# Patient Record
Sex: Female | Born: 1995
Health system: Southern US, Community
[De-identification: ages and names within clinical notes are randomized; demographics above are authoritative.]

## PROBLEM LIST (undated history)

## (undated) DIAGNOSIS — F509 Eating disorder, unspecified: Secondary | ICD-10-CM

## (undated) HISTORY — DX: Eating disorder, unspecified: F50.9

---
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
## 2017-10-18 DIAGNOSIS — J069 Acute upper respiratory infection, unspecified: Secondary | ICD-10-CM | POA: Diagnosis not present

## 2017-10-18 DIAGNOSIS — R69 Illness, unspecified: Secondary | ICD-10-CM | POA: Diagnosis not present

## 2017-12-08 DIAGNOSIS — F4323 Adjustment disorder with mixed anxiety and depressed mood: Secondary | ICD-10-CM | POA: Diagnosis not present

## 2017-12-18 DIAGNOSIS — N3001 Acute cystitis with hematuria: Secondary | ICD-10-CM | POA: Diagnosis not present

## 2018-02-03 DIAGNOSIS — K5900 Constipation, unspecified: Secondary | ICD-10-CM | POA: Diagnosis not present

## 2018-02-03 DIAGNOSIS — F419 Anxiety disorder, unspecified: Secondary | ICD-10-CM | POA: Diagnosis not present

## 2018-02-03 DIAGNOSIS — Z6829 Body mass index (BMI) 29.0-29.9, adult: Secondary | ICD-10-CM | POA: Diagnosis not present

## 2018-02-03 DIAGNOSIS — Z0000 Encounter for general adult medical examination without abnormal findings: Secondary | ICD-10-CM | POA: Diagnosis not present

## 2018-02-03 DIAGNOSIS — Z23 Encounter for immunization: Secondary | ICD-10-CM | POA: Diagnosis not present

## 2018-02-03 DIAGNOSIS — F418 Other specified anxiety disorders: Secondary | ICD-10-CM | POA: Diagnosis not present

## 2018-03-09 DIAGNOSIS — J111 Influenza due to unidentified influenza virus with other respiratory manifestations: Secondary | ICD-10-CM | POA: Diagnosis not present

## 2018-06-30 DIAGNOSIS — J069 Acute upper respiratory infection, unspecified: Secondary | ICD-10-CM | POA: Diagnosis not present

## 2018-06-30 DIAGNOSIS — R05 Cough: Secondary | ICD-10-CM | POA: Diagnosis not present

## 2018-08-20 DIAGNOSIS — Z1159 Encounter for screening for other viral diseases: Secondary | ICD-10-CM | POA: Diagnosis not present

## 2018-12-12 DIAGNOSIS — Z20828 Contact with and (suspected) exposure to other viral communicable diseases: Secondary | ICD-10-CM | POA: Diagnosis not present

## 2019-01-02 DIAGNOSIS — Z20828 Contact with and (suspected) exposure to other viral communicable diseases: Secondary | ICD-10-CM | POA: Diagnosis not present

## 2019-02-26 DIAGNOSIS — F418 Other specified anxiety disorders: Secondary | ICD-10-CM | POA: Diagnosis not present

## 2019-02-26 DIAGNOSIS — F419 Anxiety disorder, unspecified: Secondary | ICD-10-CM | POA: Diagnosis not present

## 2019-02-26 DIAGNOSIS — K5900 Constipation, unspecified: Secondary | ICD-10-CM | POA: Diagnosis not present

## 2019-02-26 DIAGNOSIS — R300 Dysuria: Secondary | ICD-10-CM | POA: Diagnosis not present

## 2019-02-26 DIAGNOSIS — E7849 Other hyperlipidemia: Secondary | ICD-10-CM | POA: Diagnosis not present

## 2019-02-26 DIAGNOSIS — Z0000 Encounter for general adult medical examination without abnormal findings: Secondary | ICD-10-CM | POA: Diagnosis not present

## 2019-03-01 DIAGNOSIS — Z202 Contact with and (suspected) exposure to infections with a predominantly sexual mode of transmission: Secondary | ICD-10-CM | POA: Diagnosis not present

## 2019-03-01 DIAGNOSIS — Z1151 Encounter for screening for human papillomavirus (HPV): Secondary | ICD-10-CM | POA: Diagnosis not present

## 2019-03-01 DIAGNOSIS — Z130 Encounter for screening for diseases of the blood and blood-forming organs and certain disorders involving the immune mechanism: Secondary | ICD-10-CM | POA: Diagnosis not present

## 2019-03-01 DIAGNOSIS — Z793 Long term (current) use of hormonal contraceptives: Secondary | ICD-10-CM | POA: Diagnosis not present

## 2019-03-01 DIAGNOSIS — Z124 Encounter for screening for malignant neoplasm of cervix: Secondary | ICD-10-CM | POA: Diagnosis not present

## 2019-03-01 DIAGNOSIS — Z01419 Encounter for gynecological examination (general) (routine) without abnormal findings: Secondary | ICD-10-CM | POA: Diagnosis not present

## 2019-03-09 ENCOUNTER — Ambulatory Visit: Payer: BC Managed Care – PPO | Attending: Internal Medicine

## 2019-03-09 DIAGNOSIS — Z20822 Contact with and (suspected) exposure to covid-19: Secondary | ICD-10-CM | POA: Diagnosis not present

## 2019-03-10 LAB — NOVEL CORONAVIRUS, NAA: SARS-CoV-2, NAA: NOT DETECTED

## 2019-06-20 DIAGNOSIS — Z1152 Encounter for screening for COVID-19: Secondary | ICD-10-CM | POA: Diagnosis not present

## 2019-06-20 DIAGNOSIS — B349 Viral infection, unspecified: Secondary | ICD-10-CM | POA: Diagnosis not present

## 2019-12-11 DIAGNOSIS — R82998 Other abnormal findings in urine: Secondary | ICD-10-CM | POA: Diagnosis not present
